# Patient Record
Sex: Male | Born: 1995 | Hispanic: Yes | Marital: Single | Smoking: Former smoker
Health system: Southern US, Community
[De-identification: ages and names within clinical notes are randomized; demographics above are authoritative.]

---

## 2017-01-05 ENCOUNTER — Encounter (HOSPITAL_COMMUNITY): Payer: Self-pay | Admitting: Emergency Medicine

## 2017-01-05 ENCOUNTER — Emergency Department (HOSPITAL_COMMUNITY)
Admission: EM | Admit: 2017-01-05 | Discharge: 2017-01-06 | Disposition: A | Discharge status: Home / Self Care | Payer: Managed Care, Other (non HMO) | Attending: Emergency Medicine | Admitting: Emergency Medicine

## 2017-01-05 DIAGNOSIS — T148XXA Other injury of unspecified body region, initial encounter: Secondary | ICD-10-CM

## 2017-01-05 DIAGNOSIS — Y999 Unspecified external cause status: Secondary | ICD-10-CM | POA: Diagnosis not present

## 2017-01-05 DIAGNOSIS — S0083XA Contusion of other part of head, initial encounter: Secondary | ICD-10-CM | POA: Insufficient documentation

## 2017-01-05 DIAGNOSIS — S0990XA Unspecified injury of head, initial encounter: Secondary | ICD-10-CM

## 2017-01-05 DIAGNOSIS — S0101XA Laceration without foreign body of scalp, initial encounter: Secondary | ICD-10-CM | POA: Diagnosis not present

## 2017-01-05 DIAGNOSIS — F17210 Nicotine dependence, cigarettes, uncomplicated: Secondary | ICD-10-CM | POA: Insufficient documentation

## 2017-01-05 DIAGNOSIS — Y939 Activity, unspecified: Secondary | ICD-10-CM | POA: Insufficient documentation

## 2017-01-05 DIAGNOSIS — Y929 Unspecified place or not applicable: Secondary | ICD-10-CM | POA: Insufficient documentation
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
## 2017-01-05 NOTE — ED Notes (Signed)
Patient transported to CT 
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
## 2017-01-06 ENCOUNTER — Emergency Department (HOSPITAL_COMMUNITY): Payer: Managed Care, Other (non HMO)

## 2017-01-06 ENCOUNTER — Encounter (HOSPITAL_COMMUNITY): Payer: Self-pay | Admitting: *Deleted

## 2017-01-06 ENCOUNTER — Emergency Department (HOSPITAL_COMMUNITY)
Admission: EM | Admit: 2017-01-06 | Discharge: 2017-01-06 | Disposition: A | Discharge status: Home / Self Care | Payer: Managed Care, Other (non HMO) | Source: Home / Self Care | Attending: Emergency Medicine | Admitting: Emergency Medicine

## 2017-01-06 DIAGNOSIS — S098XXD Other specified injuries of head, subsequent encounter: Secondary | ICD-10-CM | POA: Insufficient documentation

## 2017-01-06 DIAGNOSIS — F17210 Nicotine dependence, cigarettes, uncomplicated: Secondary | ICD-10-CM

## 2017-01-06 DIAGNOSIS — G44209 Tension-type headache, unspecified, not intractable: Secondary | ICD-10-CM

## 2017-01-06 MED ORDER — ACETAMINOPHEN 500 MG PO TABS
1000.0000 mg | ORAL_TABLET | Freq: Once | ORAL | Status: DC
  Filled 2017-01-06: qty 2

## 2017-01-06 MED ORDER — HYDROCODONE-ACETAMINOPHEN 5-325 MG PO TABS: 2.0000 | ORAL_TABLET | Freq: Once | ORAL | Status: DC

## 2017-01-06 MED ORDER — HYDROCODONE-ACETAMINOPHEN 5-325 MG PO TABS
2.0000 | ORAL_TABLET | Freq: Once | ORAL | Status: AC
  Administered 2017-01-06: 2 via ORAL
  Filled 2017-01-06: qty 2

## 2017-01-06 MED ORDER — LIDOCAINE HCL (PF) 1 % IJ SOLN
10.0000 mL | Freq: Once | INTRAMUSCULAR | Status: DC
  Filled 2017-01-06: qty 10

## 2017-01-06 MED ORDER — LIDOCAINE-EPINEPHRINE-TETRACAINE (LET) SOLUTION
3.0000 mL | Freq: Once | NASAL | Status: AC
  Administered 2017-01-06: 01:00:00 3 mL via TOPICAL
  Filled 2017-01-06: qty 3
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
## 2017-01-06 NOTE — Discharge Instructions (Signed)
Keep the wound clean and dry for the first 24 hours. After that you may gently clean the wound with soap and water. Make sure to pat dry the wound before covering it with any dressing. You can use topical antibiotic ointment and bandage. Ice and elevate for pain relief.   You can take Tylenol or Ibuprofen as directed for pain. You can alternate Tylenol and Ibuprofen every 4 hours. If you take Tylenol at 1pm, then you can take Ibuprofen at 5pm. Then you can take Tylenol again at 9pm.   Return to the Emergency Department, your primary care doctor, or the [facility] in 7-10 days for suture removal.   Monitor closely for any signs of infection. Return to the Emergency Department for any worsening redness/swelling of the area that begins to spread, drainage from the site, worsening pain, fever or any other worsening or concerning symptoms.

## 2017-01-06 NOTE — Discharge Instructions (Signed)
Please reattach information regarding your condition. Follow-up and appropriate time for staple removal. Take Tylenol and ibuprofen alternating for the next 2-3 days to help with headache relief. Apply ice to affected area as tolerated. Follow-up with your PCP for further evaluation. Return to ED for vision changes, additional head injury, loss of consciousness, lightheadedness, increased vomiting.

## 2017-01-06 NOTE — ED Notes (Signed)
Declined W/C at D/C and was escorted to lobby by RN. 
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
## 2017-01-06 NOTE — ED Notes (Signed)
ED Provider at bedside. 
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
## 2017-01-15 ENCOUNTER — Encounter (HOSPITAL_COMMUNITY): Payer: Self-pay

## 2017-01-15 ENCOUNTER — Emergency Department (HOSPITAL_COMMUNITY)
Admission: EM | Admit: 2017-01-15 | Discharge: 2017-01-16 | Disposition: A | Discharge status: Home / Self Care | Payer: Managed Care, Other (non HMO) | Attending: Emergency Medicine | Admitting: Emergency Medicine

## 2017-01-15 DIAGNOSIS — Z4802 Encounter for removal of sutures: Secondary | ICD-10-CM | POA: Diagnosis not present

## 2017-01-15 DIAGNOSIS — F17210 Nicotine dependence, cigarettes, uncomplicated: Secondary | ICD-10-CM | POA: Diagnosis not present

## 2017-01-15 NOTE — ED Notes (Signed)
Pt did not show up in waiting room 

## 2017-01-15 NOTE — ED Triage Notes (Signed)
Pt here for suture removal to the back of head, no redness or swelling noted.

## 2017-01-15 NOTE — ED Notes (Signed)
Attempt to call Pt x1 
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
## 2017-01-16 NOTE — ED Notes (Signed)
ED Provider at bedside. 

## 2017-01-16 NOTE — ED Notes (Signed)
Unable to sign e signature due to pad not working  

## 2018-08-31 IMAGING — CT CT HEAD W/O CM
3 series · 14 of 47 positions shown, 16 images · non-contrast
Comparison: None.

CLINICAL DATA: Fell down 3 stairs, hit back of head.

EXAM:
CT HEAD WITHOUT CONTRAST
TECHNIQUE: Contiguous axial images were obtained from the base of the skull
through the vertex without intravenous contrast.

[Series 3: head 5.0 h30s · axial · 0.44mm/px · z∈[-67,+68]mm · 8 of 33 slices shown, 10 images]
[im 3/33  brain]
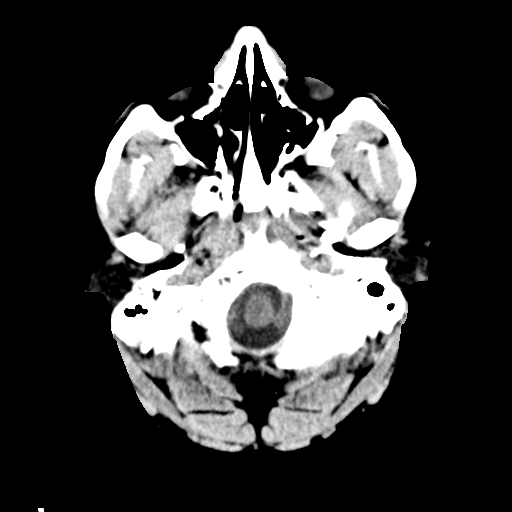
[im 3/33  bone]
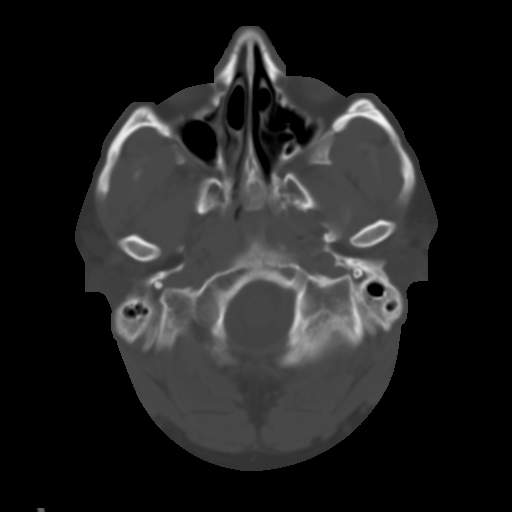
[im 7/33  brain]
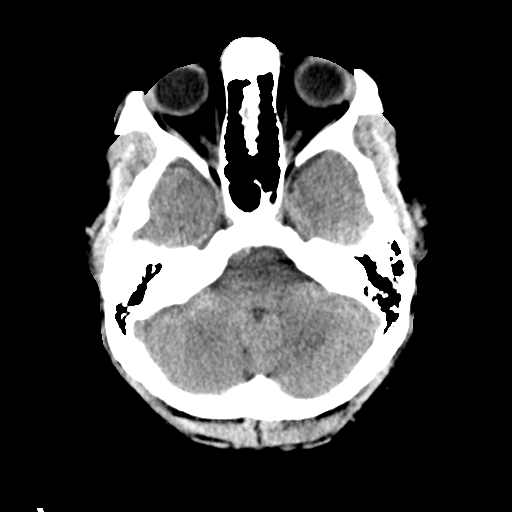
[im 10/33  brain]
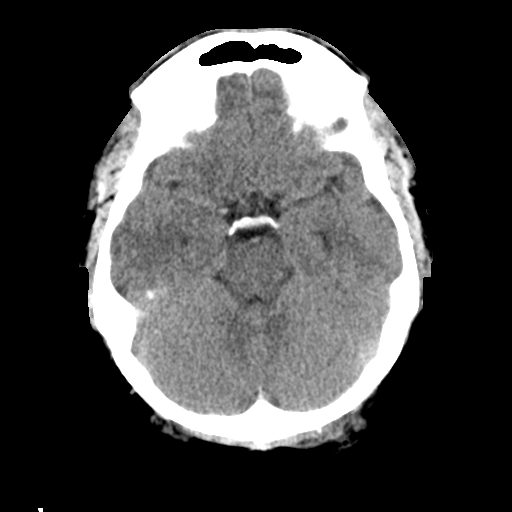
[im 15/33  brain]
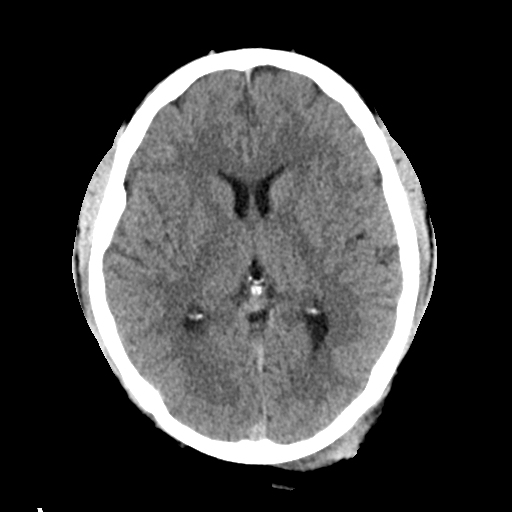
[im 18/33  brain]
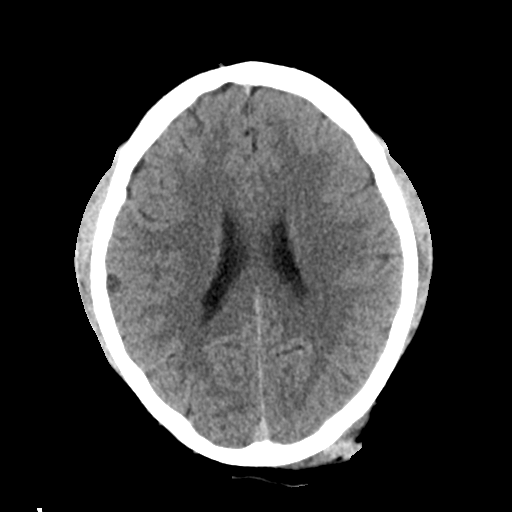
[im 18/33  bone]
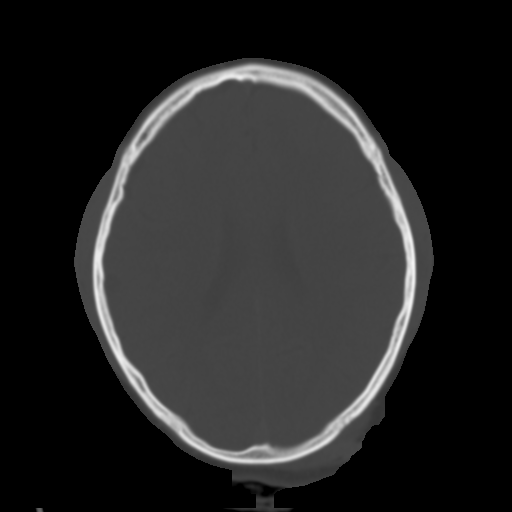
[im 23/33  brain]
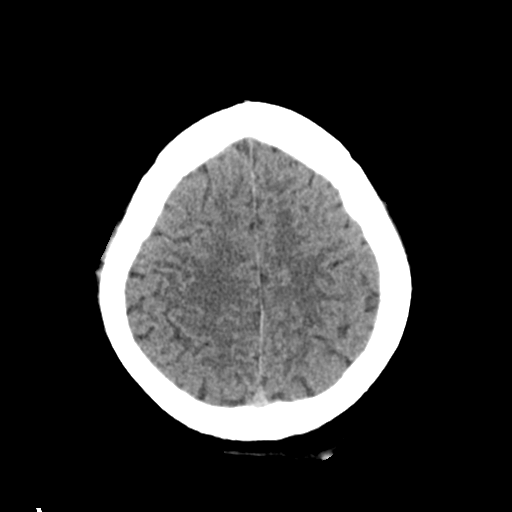
[im 26/33  brain]
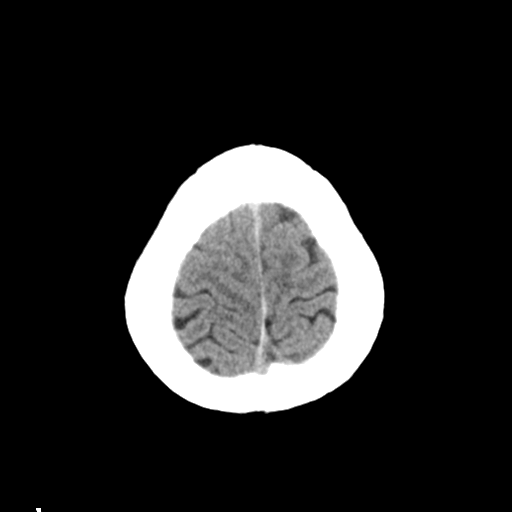
[im 30/33  brain]
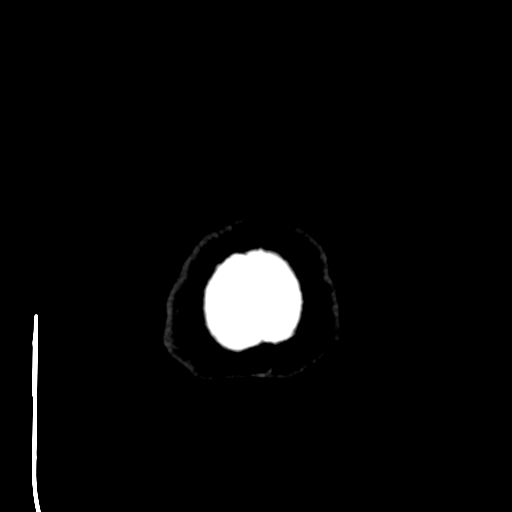

[Series 5: head 3.0 mpr cor · coronal · 0.32mm/px · 3 of 75 slices shown]
[im 25/75  brain]
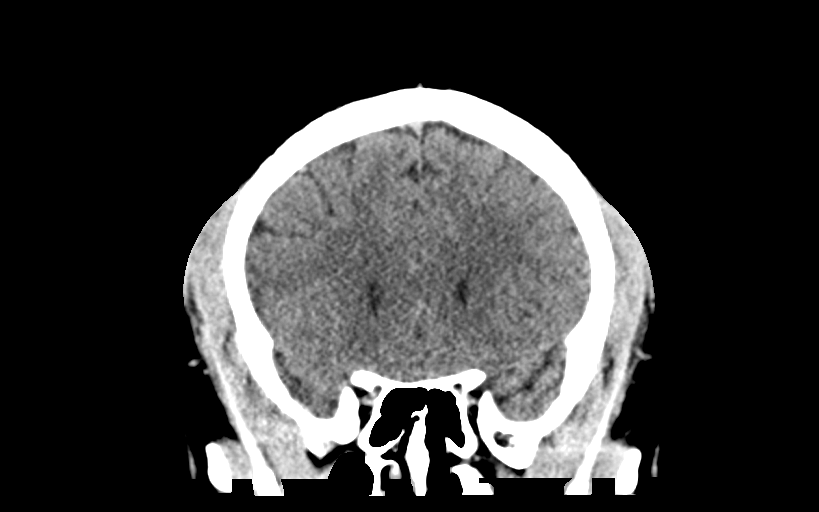
[im 33/75  brain]
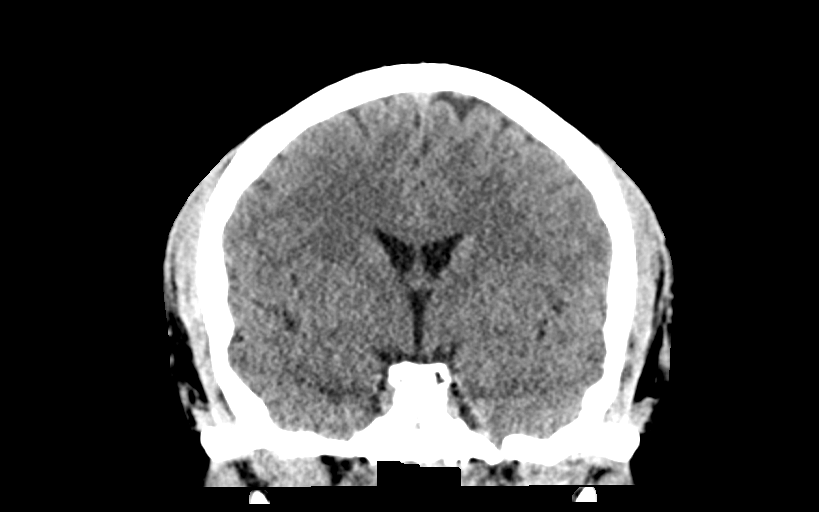
[im 42/75  brain]
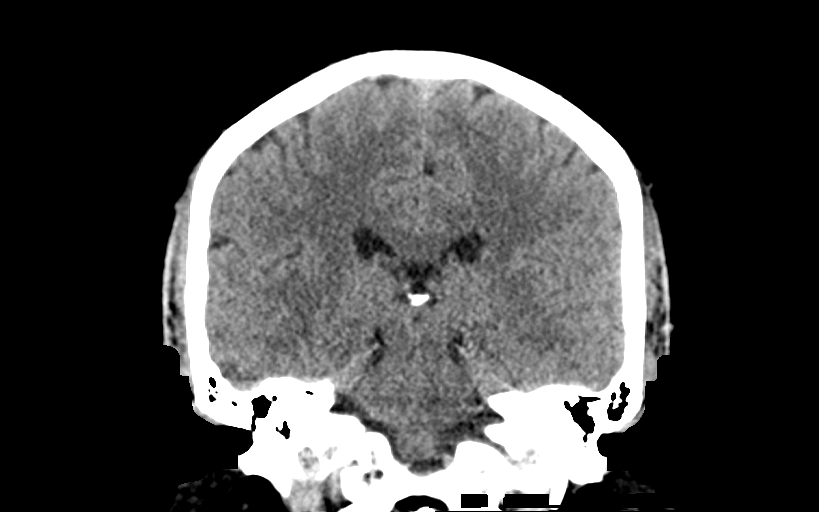

[Series 6: head 3.0 mpr sag · sagittal · 0.32mm/px · 3 of 67 slices shown]
[im 23/67  brain]
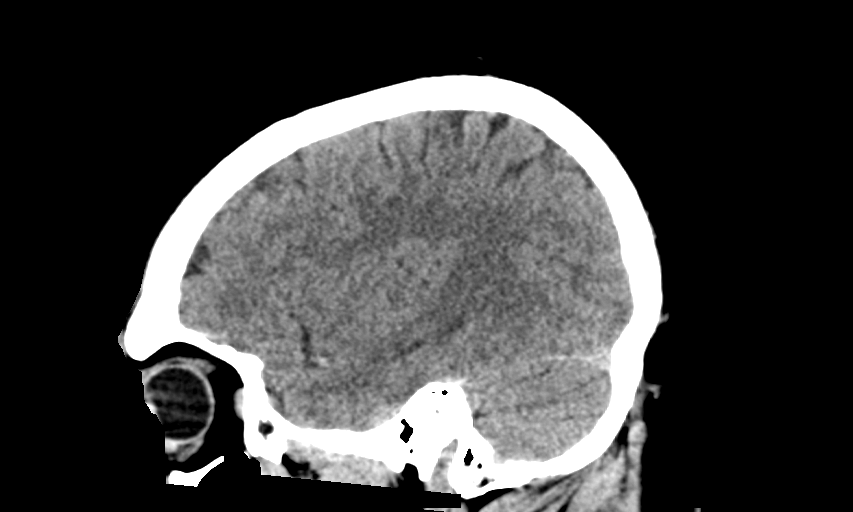
[im 34/67  brain]
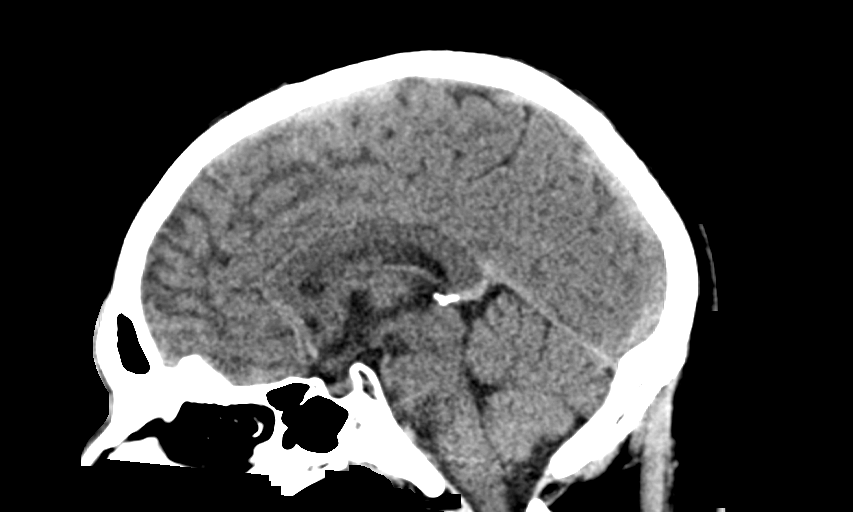
[im 45/67  brain]
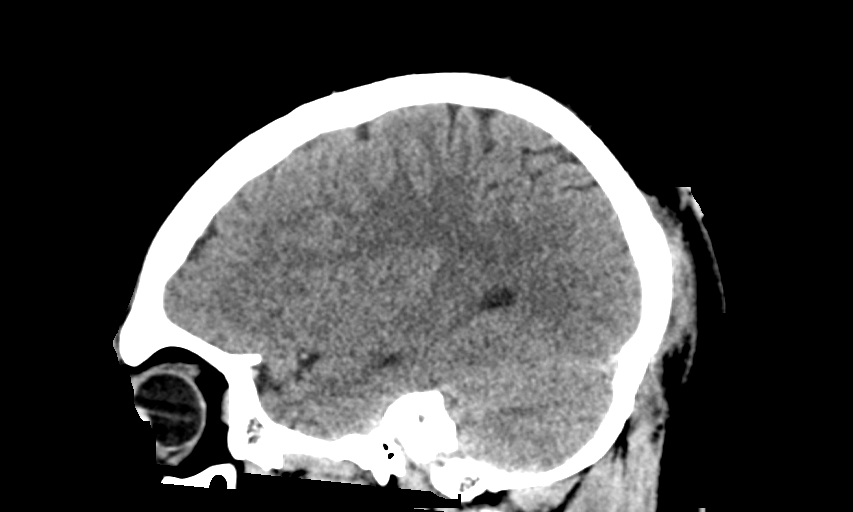

[14 of 47 positions shown; findings below may reference images not displayed]

FINDINGS: BRAIN: No intraparenchymal hemorrhage, mass effect nor midline
shift. The ventricles and sulci are normal. No acute large vascular
territory infarcts. No abnormal extra-axial fluid collections. Basal
cisterns are patent.

VASCULAR: Unremarkable.

SKULL/SOFT TISSUES: No skull fracture. Moderate LEFT posterior scalp
hematoma with subcutaneous gas. No radiopaque foreign bodies.

ORBITS/SINUSES: The included ocular globes and orbital contents are
normal.Subcentimeter LEFT maxillary mucosal retention cyst. No
paranasal sinus air-fluid levels. Mastoid air cells are well
aerated.

OTHER: None.
IMPRESSION: 1. No acute intracranial process. Moderate LEFT posterior scalp
hematoma and laceration. No skull fracture.
2. Otherwise negative noncontrast CT HEAD.

## 2022-02-28 ENCOUNTER — Ambulatory Visit (HOSPITAL_COMMUNITY)
Admission: EM | Admit: 2022-02-28 | Discharge: 2022-02-28 | Disposition: A | Discharge status: Home / Self Care | Payer: Managed Care, Other (non HMO) | Attending: Family Medicine | Admitting: Family Medicine

## 2022-02-28 ENCOUNTER — Encounter (HOSPITAL_COMMUNITY): Payer: Self-pay | Admitting: *Deleted

## 2022-02-28 DIAGNOSIS — Z1152 Encounter for screening for COVID-19: Secondary | ICD-10-CM | POA: Insufficient documentation

## 2022-02-28 DIAGNOSIS — Z79899 Other long term (current) drug therapy: Secondary | ICD-10-CM | POA: Insufficient documentation

## 2022-02-28 DIAGNOSIS — J069 Acute upper respiratory infection, unspecified: Secondary | ICD-10-CM

## 2022-02-28 DIAGNOSIS — R112 Nausea with vomiting, unspecified: Secondary | ICD-10-CM | POA: Insufficient documentation

## 2022-02-28 DIAGNOSIS — J101 Influenza due to other identified influenza virus with other respiratory manifestations: Secondary | ICD-10-CM | POA: Insufficient documentation

## 2022-02-28 MED ORDER — ONDANSETRON 4 MG PO TBDP: 4.0000 mg | ORAL_TABLET | Freq: Three times a day (TID) | ORAL | 0 refills | Status: DC | PRN

## 2022-02-28 NOTE — ED Provider Notes (Signed)
[facility]    CSN: [id number] Arrival date & time: 02/28/22  1359      History   Chief Complaint Chief Complaint  Patient presents with   Emesis   Cough   Chills    HPI David Lucas is a 26 y.o. male.    Emesis Associated symptoms: cough   Cough  Here for cough and congestion and vomiting.  Symptoms began the evening of December 17.  He has fever here of 101.1.  He has had some nausea and is thrown up 2 or 3 times.  He is having some rhinorrhea also.  He states his "lungs" hurt when he is outside.  He shows me the anterior lower chest that hurts when he is outside.  Though he has been able to eat and has not had any appetite, he has had some liquids and today including a smoothie.  No known allergies.  He states he has a history of ulcers he cannot have anything acidic  History reviewed. No pertinent past medical history.  There are no problems to display for this patient.   History reviewed. No pertinent surgical history.     Home Medications    Prior to Admission medications   Medication Sig Start Date End Date Taking? Authorizing Provider  ondansetron (ZOFRAN-ODT) 4 MG disintegrating tablet Take 1 tablet (4 mg total) by mouth every 8 (eight) hours as needed for nausea or vomiting. 02/28/22  Yes Jolette Lana, Janace Aris, MD    Family History History reviewed. No pertinent family history.  Social History Social History   Tobacco Use   Smoking status: Former    Types: Cigarettes   Smokeless tobacco: Never  Vaping Use   Vaping Use: Never used  Substance Use Topics   Alcohol use: Yes    Comment: social   Drug use: Yes    Types: Marijuana     Allergies   Patient has no known allergies.   Review of Systems Review of Systems  Respiratory:  Positive for cough.   Gastrointestinal:  Positive for vomiting.     Physical Exam Triage Vital Signs ED Triage Vitals  Enc Vitals Group     BP 02/28/22 1812 (!) 148/84     Pulse Rate  02/28/22 1812 93     Resp 02/28/22 1812 18     Temp 02/28/22 1812 (!) 101.1 F (38.4 C)     Temp Source 02/28/22 1812 Oral     SpO2 02/28/22 1812 96 %     Weight --      Height --      Head Circumference --      Peak Flow --      Pain Score 02/28/22 1811 0     Pain Loc --      Pain Edu? --      Excl. in GC? --    No data found.  Updated Vital Signs BP (!) 148/84 (BP Location: Left Arm)   Pulse 93   Temp (!) 101.1 F (38.4 C) (Oral)   Resp 18   SpO2 96%   Visual Acuity Right Eye Distance:   Left Eye Distance:   Bilateral Distance:    Right Eye Near:   Left Eye Near:    Bilateral Near:     Physical Exam Vitals reviewed.  Constitutional:      General: He is not in acute distress.    Appearance: He is not ill-appearing, toxic-appearing or diaphoretic.  HENT:     Right  Ear: Tympanic membrane and ear canal normal.     Left Ear: Tympanic membrane and ear canal normal.     Nose: Congestion present.     Mouth/Throat:     Mouth: Mucous membranes are moist.     Comments: There is clear mucus draining in the oropharynx.  No erythema Eyes:     Extraocular Movements: Extraocular movements intact.     Conjunctiva/sclera: Conjunctivae normal.     Pupils: Pupils are equal, round, and reactive to light.  Cardiovascular:     Rate and Rhythm: Normal rate and regular rhythm.     Heart sounds: No murmur heard. Pulmonary:     Effort: No respiratory distress.     Breath sounds: No stridor. No wheezing, rhonchi or rales.  Chest:     Chest wall: No tenderness.  Abdominal:     Tenderness: There is no abdominal tenderness.  Musculoskeletal:     Cervical back: Neck supple.  Lymphadenopathy:     Cervical: No cervical adenopathy.  Skin:    Capillary Refill: Capillary refill takes less than 2 seconds.     Coloration: Skin is not jaundiced or pale.  Neurological:     General: No focal deficit present.     Mental Status: He is alert and oriented to person, place, and time.   Psychiatric:        Behavior: Behavior normal.      UC Treatments / Results  Labs (all labs ordered are listed, but only abnormal results are displayed) Labs Reviewed  RESP PANEL BY RT-PCR (FLU A&B, COVID) [id number]    EKG   Radiology No results found.  Procedures Procedures (including critical care time)  Medications Ordered in UC Medications - No data to display  Initial Impression / Assessment and Plan / UC Course  I have reviewed the triage vital signs and the nursing notes.  Pertinent labs & imaging results that were available during my care of the patient were reviewed by me and considered in my medical decision making (see chart for details).        I discussed with patient that he has most likely a viral illness.  He is swabbed for flu and COVID.  If he is positive for flu he would benefit from Tamiflu.  If he is positive for COVID, he will know if he needs to quarantine.  Zofran sent in for his nausea Final Clinical Impressions(s) / UC Diagnoses   Final diagnoses:  Viral upper respiratory tract infection  Nausea and vomiting, unspecified vomiting type     Discharge Instructions      Ondansetron dissolved in the mouth every 8 hours as needed for nausea or vomiting. Clear liquids and bland things to eat.   You have been swabbed for COVID and flu, and the test will result in the next 24 hours. Our staff will call you if positive. If the COVID test is positive, you should quarantine for 5 days from the start of your symptoms      ED Prescriptions     Medication Sig Dispense Auth. Provider   ondansetron (ZOFRAN-ODT) 4 MG disintegrating tablet Take 1 tablet (4 mg total) by mouth every 8 (eight) hours as needed for nausea or vomiting. 10 tablet Marlinda Mike Janace Aris, MD      PDMP not reviewed this encounter.   Zenia Resides, MD 02/28/22 [phone]

## 2022-02-28 NOTE — Discharge Instructions (Signed)
Ondansetron dissolved in the mouth every 8 hours as needed for nausea or vomiting. Clear liquids and bland things to eat.   You have been swabbed for COVID and flu, and the test will result in the next 24 hours. Our staff will call you if positive. If the COVID test is positive, you should quarantine for 5 days from the start of your symptoms  

## 2022-02-28 NOTE — ED Triage Notes (Signed)
Pt states he has had a cough, vomiting and chills X 2 days. He took some night time cough meds and it didn't help.

## 2022-03-01 LAB — RESP PANEL BY RT-PCR (FLU A&B, COVID) ARPGX2
Influenza A by PCR: NEGATIVE
Influenza B by PCR: POSITIVE — AB
SARS Coronavirus 2 by RT PCR: NEGATIVE

## 2023-06-07 ENCOUNTER — Emergency Department (HOSPITAL_COMMUNITY)
Admission: EM | Admit: 2023-06-07 | Discharge: 2023-06-07 | Disposition: A | Discharge status: Home / Self Care | Payer: Self-pay | Attending: Emergency Medicine | Admitting: Emergency Medicine

## 2023-06-07 ENCOUNTER — Encounter (HOSPITAL_COMMUNITY): Payer: Self-pay

## 2023-06-07 ENCOUNTER — Other Ambulatory Visit: Payer: Self-pay

## 2023-06-07 DIAGNOSIS — T50901A Poisoning by unspecified drugs, medicaments and biological substances, accidental (unintentional), initial encounter: Secondary | ICD-10-CM | POA: Insufficient documentation

## 2023-06-07 DIAGNOSIS — R410 Disorientation, unspecified: Secondary | ICD-10-CM | POA: Insufficient documentation

## 2023-06-07 DIAGNOSIS — R739 Hyperglycemia, unspecified: Secondary | ICD-10-CM

## 2023-06-07 DIAGNOSIS — R7309 Other abnormal glucose: Secondary | ICD-10-CM | POA: Insufficient documentation

## 2023-06-07 DIAGNOSIS — R61 Generalized hyperhidrosis: Secondary | ICD-10-CM | POA: Insufficient documentation

## 2023-06-07 LAB — COMPREHENSIVE METABOLIC PANEL WITH GFR
ALT: 17 U/L (ref 0–44)
AST: 26 U/L (ref 15–41)
Albumin: 4.7 g/dL (ref 3.5–5.0)
Alkaline Phosphatase: 71 U/L (ref 38–126)
Anion gap: 11 (ref 5–15)
BUN: 16 mg/dL (ref 6–20)
CO2: 26 mmol/L (ref 22–32)
Calcium: 9.1 mg/dL (ref 8.9–10.3)
Chloride: 101 mmol/L (ref 98–111)
Creatinine, Ser: 0.98 mg/dL (ref 0.61–1.24)
GFR, Estimated: >60 mL/min (ref >60)
Glucose, Bld: 141 mg/dL — ABNORMAL HIGH (ref 70–99)
Potassium: 3.5 mmol/L (ref 3.5–5.1)
Sodium: 138 mmol/L (ref 135–145)
Total Bilirubin: 0.4 mg/dL (ref 0.0–1.2)
Total Protein: 8 g/dL (ref 6.5–8.1)

## 2023-06-07 LAB — CBC
HCT: 44.7 % (ref 39.0–52.0)
Hemoglobin: 14.3 g/dL (ref 13.0–17.0)
MCH: 30 pg (ref 26.0–34.0)
MCHC: 32 g/dL (ref 30.0–36.0)
MCV: 93.7 fL (ref 80.0–100.0)
Platelets: 218 10*3/uL (ref 150–400)
RBC: 4.77 MIL/uL (ref 4.22–5.81)
RDW: 12.2 % (ref 11.5–15.5)
WBC: 7.5 10*3/uL (ref 4.0–10.5)
nRBC: 0 % (ref 0.0–0.2)

## 2023-06-07 LAB — LIPASE, BLOOD: Lipase: 26 U/L (ref 11–51)

## 2023-06-07 MED ORDER — PANTOPRAZOLE SODIUM 40 MG IV SOLR
40.0000 mg | Freq: Once | INTRAVENOUS | Status: AC
  Administered 2023-06-07: 40 mg via INTRAVENOUS
  Filled 2023-06-07: qty 10

## 2023-06-07 MED ORDER — ONDANSETRON 4 MG PO TBDP: 4.0000 mg | ORAL_TABLET | Freq: Three times a day (TID) | ORAL | 0 refills | Status: AC | PRN

## 2023-06-07 MED ORDER — ONDANSETRON HCL 4 MG/2ML IJ SOLN
4.0000 mg | Freq: Once | INTRAMUSCULAR | Status: AC
  Administered 2023-06-07: 4 mg via INTRAVENOUS
  Filled 2023-06-07: qty 2

## 2023-06-07 MED ORDER — PANTOPRAZOLE SODIUM 40 MG PO TBEC: 40.0000 mg | DELAYED_RELEASE_TABLET | Freq: Every day | ORAL | 0 refills | Status: AC

## 2023-06-07 MED ORDER — SODIUM CHLORIDE 0.9 % IV BOLUS
1000.0000 mL | Freq: Once | INTRAVENOUS | Status: AC
  Administered 2023-06-07: 1000 mL via INTRAVENOUS

## 2023-06-07 NOTE — ED Triage Notes (Signed)
 Pt brought by EMS from home. Took delta gummy around 6:30pm. 2 episodes of vomiting witnessed by EMS. 4mg  zofran given en route. Improved nausea afterwards. Headache reported at this time. Hx of GI issues.

## 2023-06-07 NOTE — ED Notes (Signed)
 Pt states he is slightly dizzy but was able to ambulate without any assistance.

## 2023-06-07 NOTE — ED Provider Notes (Signed)
 [place] EMERGENCY DEPARTMENT AT [facility] Provider Note   CSN: [id number] Arrival date & time: 06/07/23  0051     History  Chief Complaint  Patient presents with   Ingestion   Vomiting    David Lucas is a 28 y.o. male.  The history is provided by the patient and a relative.  Ingestion  He has history of peptic ulcer disease and GERD and got sick following eating an unknown gummy.  Apparently, he had not eaten all day and the friend gave him either 1 or 2 Gummies.  He does not know exactly what it was.  Following this, he started complaining of generalized abdominal pain and nausea.  His brother states that he tried to have him eat various things but he vomited after attempting to eat.  He got very sweaty and somewhat confused.  He received a dose of ondansetron and nausea has improved, but his mental status is still not at its baseline.  He does occasionally use marijuana denies other drug use.  He is a non-smoker and nondrinker.   Home Medications Prior to Admission medications   Medication Sig Start Date End Date Taking? Authorizing Provider  pantoprazole (PROTONIX) 40 MG tablet Take 1 tablet (40 mg total) by mouth daily. 06/07/23  Yes Dione Booze, MD  ondansetron (ZOFRAN-ODT) 4 MG disintegrating tablet Take 1 tablet (4 mg total) by mouth every 8 (eight) hours as needed for nausea or vomiting. 06/07/23   Dione Booze, MD      Allergies    Patient has no known allergies.    Review of Systems   Review of Systems  All other systems reviewed and are negative.   Physical Exam Updated Vital Signs BP 124/70 (BP Location: Left Arm)   Pulse 73   Temp 98.4 F (36.9 C) (Oral)   Resp 18   Ht 5\' 9"  (1.753 m)   Wt 104.3 kg   SpO2 93%   BMI 33.97 kg/m  Physical Exam Vitals and nursing note reviewed.   28 year old male, resting comfortably and in no acute distress. Vital signs are normal. Oxygen saturation is 98%, which is normal. Head is normocephalic and  atraumatic. PERRLA, EOMI. Oropharynx is clear. Lungs are clear without rales, wheezes, or rhonchi. Chest is nontender. Heart has regular rate and rhythm without murmur. Abdomen is soft, flat, nontender. Extremities have no cyanosis or edema, full range of motion is present. Skin is warm and dry without rash. Neurologic: Sleepy but arousable, when aroused oriented to person and place and time, cranial nerves are intact, moves all extremities equally.  ED Results / Procedures / Treatments   Labs (all labs ordered are listed, but only abnormal results are displayed) Labs Reviewed  COMPREHENSIVE METABOLIC PANEL - Abnormal; Notable for the following components:      Result Value   Glucose, Bld 141 (*)    All other components within normal limits  LIPASE, BLOOD  CBC  URINALYSIS, ROUTINE W REFLEX MICROSCOPIC  RAPID URINE DRUG SCREEN, HOSP PERFORMED    EKG None  Radiology No results found.  Procedures Procedures    Medications Ordered in ED Medications  pantoprazole (PROTONIX) injection 40 mg (40 mg Intravenous Given 06/07/23 0307)  sodium chloride 0.9 % bolus 1,000 mL (0 mLs Intravenous Stopped 06/07/23 0711)  ondansetron (ZOFRAN) injection 4 mg (4 mg Intravenous Given 06/07/23 0600)    ED Course/ Medical Decision Making/ A&P  Medical Decision Making Amount and/or Complexity of Data Reviewed Labs: ordered.  Risk Prescription drug management.   Vomiting and altered mental status following ingestion of an unknown gummy.  Based on his presentation, I strongly suspect it was a CBD gummy, but it is not known what strength.  His exam is benign.  I have ordered some IV fluids and pantoprazole for known history of GERD and peptic ulcer disease.  I have reviewed his laboratory tests, my interpretation is elevated random glucose level and otherwise normal comprehensive metabolic panel, normal CBC, normal lipase.  At this point, I believe he just needs  supportive care.  After observation in the ED, patient was able to have a conversation.  I ambulated him in the emergency department and he was able to ambulate, but he was somewhat shaky and his heart rate went up significantly.  I ordered IV fluids, and following this he is feeling much better and heart rate is normal.  I feel he is safe for discharge.  His family member states that he will watch him closely and return if there are any new symptoms.  I have told him not to take over-the-counter cannabis edibles or any other medication which is not prescribed for him.  I am discharging him with prescriptions for pantoprazole and ondansetron oral dissolving tablet.  Final Clinical Impression(s) / ED Diagnoses Final diagnoses:  Accidental drug overdose, initial encounter  Elevated random blood glucose level    Rx / DC Orders ED Discharge Orders          Ordered    ondansetron (ZOFRAN-ODT) 4 MG disintegrating tablet  Every 8 hours PRN        06/07/23 0708    pantoprazole (PROTONIX) 40 MG tablet  Daily        06/07/23 0708              Dione Booze, MD 06/07/23 [phone]

## 2023-06-07 NOTE — Discharge Instructions (Addendum)
 Do not take any medication which is not prescribed for you.  Also, if you ever use CBD edibles, he have to be very careful not to take too much.  Some of them are very concentrated and you can overdose by taking a whole one or even a half or a fourth of onew.

## 2023-08-18 ENCOUNTER — Emergency Department (HOSPITAL_COMMUNITY)
Admission: EM | Admit: 2023-08-18 | Discharge: 2023-08-19 | Disposition: A | Discharge status: Home / Self Care | Payer: Self-pay | Attending: Emergency Medicine | Admitting: Emergency Medicine

## 2023-08-18 ENCOUNTER — Other Ambulatory Visit: Payer: Self-pay

## 2023-08-18 ENCOUNTER — Encounter (HOSPITAL_COMMUNITY): Payer: Self-pay | Admitting: *Deleted

## 2023-08-18 DIAGNOSIS — W540XXA Bitten by dog, initial encounter: Secondary | ICD-10-CM | POA: Insufficient documentation

## 2023-08-18 DIAGNOSIS — S51832A Puncture wound without foreign body of left forearm, initial encounter: Secondary | ICD-10-CM | POA: Insufficient documentation

## 2023-08-18 DIAGNOSIS — Z23 Encounter for immunization: Secondary | ICD-10-CM | POA: Insufficient documentation

## 2023-08-18 DIAGNOSIS — Y93K9 Activity, other involving animal care: Secondary | ICD-10-CM | POA: Insufficient documentation

## 2023-08-18 MED ORDER — AMOXICILLIN-POT CLAVULANATE 875-125 MG PO TABS: 1.0000 | ORAL_TABLET | Freq: Two times a day (BID) | ORAL | 0 refills | Status: DC

## 2023-08-18 MED ORDER — TETANUS-DIPHTH-ACELL PERTUSSIS 5-2.5-18.5 LF-MCG/0.5 IM SUSY
0.5000 mL | PREFILLED_SYRINGE | Freq: Once | INTRAMUSCULAR | Status: AC
  Administered 2023-08-18: 0.5 mL via INTRAMUSCULAR
  Filled 2023-08-18: qty 0.5

## 2023-08-18 MED ORDER — AMOXICILLIN-POT CLAVULANATE 875-125 MG PO TABS
1.0000 | ORAL_TABLET | Freq: Once | ORAL | Status: AC
  Administered 2023-08-18: 1 via ORAL
  Filled 2023-08-18: qty 1

## 2023-08-18 NOTE — ED Notes (Signed)
 No answer for room, moved otf

## 2023-08-18 NOTE — ED Provider Notes (Signed)
 [place] EMERGENCY DEPARTMENT AT [facility] Provider Note   CSN: [id number] Arrival date & time: 08/18/23  1936     History  Chief Complaint  Patient presents with   Animal Bite    David Lucas is a 27 y.o. male.  28 year old male presents ER today with a dog bite to his left hand.  His 2 dogs were fighting and he went to break it up and one of them clamped on his left forearm and was shaking his head back-and-forth.  Patient states that he had some pain when flexing his fingers on that hand especially the middle 2.  This worried him and brought him here for further evaluation.  The dog is his and is up-to-date on vaccines and it was a provoked attack.  Patient is unsure when his last tetanus thinks it might have been 7 years ago but is not positive.  No injuries elsewhere.  No concern for foreign bodies.   Animal Bite      Home Medications Prior to Admission medications   Medication Sig Start Date End Date Taking? Authorizing Provider  amoxicillin-clavulanate (AUGMENTIN) 875-125 MG tablet Take 1 tablet by mouth every 12 (twelve) hours. 08/18/23  Yes Vernelle Wisner, Reymundo Caulk, MD  ondansetron  (ZOFRAN -ODT) 4 MG disintegrating tablet Take 1 tablet (4 mg total) by mouth every 8 (eight) hours as needed for nausea or vomiting. 06/07/23   Alissa April, MD  pantoprazole  (PROTONIX ) 40 MG tablet Take 1 tablet (40 mg total) by mouth daily. 06/07/23   Alissa April, MD      Allergies    Patient has no known allergies.    Review of Systems   Review of Systems  Physical Exam Updated Vital Signs BP 129/75 (BP Location: Right Arm)   Pulse 90   Temp 98.4 F (36.9 C) (Oral)   Resp 15   Ht 5\' 9"  (1.753 m)   Wt 104.3 kg   SpO2 97%   BMI 33.96 kg/m  Physical Exam Vitals and nursing note reviewed.  Constitutional:      Appearance: He is well-developed.  HENT:     Head: Normocephalic and atraumatic.  Cardiovascular:     Rate and Rhythm: Normal rate.  Pulmonary:     Effort:  Pulmonary effort is normal. No respiratory distress.  Abdominal:     General: There is no distension.  Musculoskeletal:        General: Normal range of motion.     Cervical back: Normal range of motion.  Skin:    Comments: 4 puncture wounds around the brachioradialis and proximal volar surface of his forearm with a couple abrasions.  Explored through full range of motion and the 1 on his volar surface does appear to be a little bit deep the ones on the dorsal surface less so  Neurological:     Mental Status: He is alert.     ED Results / Procedures / Treatments   Labs (all labs ordered are listed, but only abnormal results are displayed) Labs Reviewed - No data to display  EKG None  Radiology No results found.  Procedures Procedures    Medications Ordered in ED Medications  amoxicillin-clavulanate (AUGMENTIN) 875-125 MG per tablet 1 tablet (has no administration in time range)  Tdap (BOOSTRIX) injection 0.5 mL (has no administration in time range)    ED Course/ Medical Decision Making/ A&P  Medical Decision Making Risk Prescription drug management.   Copiously irrigated wounds, applied bacitracin, nonstick dressing, tube gauze and Coban.  Started on Augmentin.  Tdap updated.  Discussed delayed closure as a couple of the wounds are near tattoos but only for aesthetic purposes.  Patient understands reasoning for leaving open and reasons to return for signs or symptoms of worsening infection. No indication for rabies vaccine at this time.   Final Clinical Impression(s) / ED Diagnoses Final diagnoses:  Dog bite, initial encounter    Rx / DC Orders ED Discharge Orders          Ordered    amoxicillin-clavulanate (AUGMENTIN) 875-125 MG tablet  Every 12 hours        08/18/23 2345              Jakai Risse, Reymundo Caulk, MD 08/18/23 2349

## 2023-08-18 NOTE — ED Triage Notes (Signed)
 The pt was bitten by his dog a golden doodle  approsx one hour ago  he has 4 puncture wounds lt forearm 2 on the top and 2 on the palmar surface of his lt forearm  one on the wounds appears to be deeper than the others and the pt reports that when he moves his lt middle finger the wound hurts

## 2023-08-18 NOTE — ED Notes (Signed)
 Pt reported that dog was vaccinated and his own dog. Pt did not want to report animal bite.

## 2024-04-18 ENCOUNTER — Ambulatory Visit
Admission: EM | Admit: 2024-04-18 | Discharge: 2024-04-18 | Disposition: A | Discharge status: Home / Self Care | Payer: Self-pay | Source: Home / Self Care

## 2024-04-18 ENCOUNTER — Ambulatory Visit (INDEPENDENT_AMBULATORY_CARE_PROVIDER_SITE_OTHER): Payer: Self-pay

## 2024-04-18 ENCOUNTER — Encounter: Payer: Self-pay | Admitting: Emergency Medicine

## 2024-04-18 DIAGNOSIS — S61452A Open bite of left hand, initial encounter: Secondary | ICD-10-CM

## 2024-04-18 DIAGNOSIS — W540XXA Bitten by dog, initial encounter: Secondary | ICD-10-CM

## 2024-04-18 MED ORDER — AMOXICILLIN-POT CLAVULANATE 875-125 MG PO TABS: 1.0000 | ORAL_TABLET | Freq: Two times a day (BID) | ORAL | 0 refills | Status: AC

## 2024-04-18 NOTE — ED Provider Notes (Signed)
 " [facility]    CSN: [id number] Arrival date & time: 04/18/24  1046      History   Chief Complaint Chief Complaint  Patient presents with   Animal Bite   Puncture Wound    HPI David Lucas is a 29 y.o. male.   Pt presents today due to puncture wound of left hand that happened 5 days ago. Pt states that his dog bit him. Pt states that he started taking Augmentin  that he received in June when he went to ED for Dog bite. Pt states that he is experiencing numbness and tingling of left thumb and left index finger since incident. Last tetanus immunization was in June.   The history is provided by the patient.  Animal Bite   History reviewed. No pertinent past medical history.  There are no active problems to display for this patient.   History reviewed. No pertinent surgical history.     Home Medications    Prior to Admission medications  Medication Sig Start Date End Date Taking? Authorizing Provider  amoxicillin -clavulanate (AUGMENTIN ) 875-125 MG tablet Take 1 tablet by mouth every 12 (twelve) hours for 5 days. 04/18/24 04/23/24 Yes Andra Krabbe C, PA-C  albuterol (VENTOLIN HFA) 108 (90 Base) MCG/ACT inhaler Inhale 2 puffs into the lungs. Patient not taking: Reported on 04/18/2024 03/11/20   [provider]  famotidine (PEPCID) 20 MG tablet  11/27/19   [provider]  ibuprofen (ADVIL) 100 MG/5ML suspension Take by mouth. Patient not taking: Reported on 04/18/2024    [provider]  ondansetron  (ZOFRAN -ODT) 4 MG disintegrating tablet Take 1 tablet (4 mg total) by mouth every 8 (eight) hours as needed for nausea or vomiting. Patient not taking: Reported on 04/18/2024 06/07/23   Raford Lenis, MD  pantoprazole  (PROTONIX ) 40 MG tablet Take 1 tablet (40 mg total) by mouth daily. Patient not taking: Reported on 04/18/2024 06/07/23   Raford Lenis, MD    Family History History reviewed. No pertinent family history.  Social History Social  History[1]   Allergies   Patient has no known allergies.   Review of Systems Review of Systems   Physical Exam Triage Vital Signs ED Triage Vitals [04/18/24 1110]  Encounter Vitals Group     BP 120/72     Girls Systolic BP Percentile      Girls Diastolic BP Percentile      Boys Systolic BP Percentile      Boys Diastolic BP Percentile      Pulse Rate 71     Resp 14     Temp 98 F (36.7 C)     Temp Source Temporal     SpO2 97 %     Weight      Height      Head Circumference      Peak Flow      Pain Score 3     Pain Loc      Pain Education      Exclude from Growth Chart    No data found.  Updated Vital Signs BP 120/72 (BP Location: Left Arm)   Pulse 71   Temp 98 F (36.7 C) (Temporal)   Resp 14   SpO2 97%   Visual Acuity Right Eye Distance:   Left Eye Distance:   Bilateral Distance:    Right Eye Near:   Left Eye Near:    Bilateral Near:     Physical Exam Vitals and nursing note reviewed.  Constitutional:  General: He is not in acute distress.    Appearance: Normal appearance. He is not ill-appearing, toxic-appearing or diaphoretic.  Eyes:     General: No scleral icterus. Cardiovascular:     Rate and Rhythm: Normal rate and regular rhythm.     Heart sounds: Normal heart sounds.  Pulmonary:     Effort: Pulmonary effort is normal. No respiratory distress.     Breath sounds: Normal breath sounds. No wheezing or rhonchi.  Musculoskeletal:       Hands:     Comments: Multiple, healing puncture wounds noted of dorsal radial side of left hand  Skin:    General: Skin is warm.  Neurological:     Mental Status: He is alert and oriented to person, place, and time.  Psychiatric:        Mood and Affect: Mood normal.        Behavior: Behavior normal.      UC Treatments / Results  Labs (all labs ordered are listed, but only abnormal results are displayed) Labs Reviewed - No data to display  EKG   Radiology No results  found.  Procedures Procedures (including critical care time)  Medications Ordered in UC Medications - No data to display  Initial Impression / Assessment and Plan / UC Course  I have reviewed the triage vital signs and the nursing notes.  Pertinent labs & imaging results that were available during my care of the patient were reviewed by me and considered in my medical decision making (see chart for details).     Final Clinical Impressions(s) / UC Diagnoses   Final diagnoses:  Dog bite of left hand, initial encounter     Discharge Instructions      Soft tissue swelling noted on left hand xray, no fractures.  Be sure to take all 5 days of augmentin  in their entirety, even if you are feeling better.      ED Prescriptions     Medication Sig Dispense Auth. Provider   amoxicillin -clavulanate (AUGMENTIN ) 875-125 MG tablet Take 1 tablet by mouth every 12 (twelve) hours for 5 days. 10 tablet Andra Corean BROCKS, PA-C      PDMP not reviewed this encounter.    [1]  Social History Tobacco Use   Smoking status: Former    Types: Cigarettes   Smokeless tobacco: Never  Vaping Use   Vaping status: Never Used  Substance Use Topics   Alcohol use: Yes    Comment: social   Drug use: Yes    Types: Marijuana     Andra Corean BROCKS, PA-C 04/18/24 1240  "

## 2024-04-18 NOTE — ED Triage Notes (Signed)
 Pt reports dog bite that occurred 5 days ago. It was his personal dog - UTD on vaccines. Puncture wound to L hand - pt is concerned due to numbness and tingling that is running between L thumb and index finger. He thought this would eventually go away. Cleaned area with antibiotic cleanser from last ED visit and took leftover Augmentin  at home. Last tetanus was 08/18/23 at ED.

## 2024-04-18 NOTE — Discharge Instructions (Signed)
 Soft tissue swelling noted on left hand xray, no fractures.  Be sure to take all 5 days of augmentin  in their entirety, even if you are feeling better.
